# Patient Record
Sex: Male | Born: 1985 | Race: Black or African American | Hispanic: No | Marital: Single
Health system: Southern US, Community
[De-identification: ages and names within clinical notes are randomized; demographics above are authoritative.]

## PROBLEM LIST (undated history)

## (undated) DIAGNOSIS — J45909 Unspecified asthma, uncomplicated: Secondary | ICD-10-CM

---

## 2015-11-07 ENCOUNTER — Emergency Department (HOSPITAL_COMMUNITY): Payer: Self-pay

## 2015-11-07 ENCOUNTER — Emergency Department (HOSPITAL_COMMUNITY)
Admission: EM | Admit: 2015-11-07 | Discharge: 2015-11-07 | Disposition: A | Discharge status: Home / Self Care | Payer: Self-pay | Attending: Emergency Medicine | Admitting: Emergency Medicine

## 2015-11-07 ENCOUNTER — Encounter (HOSPITAL_COMMUNITY): Payer: Self-pay | Admitting: Emergency Medicine

## 2015-11-07 DIAGNOSIS — F1210 Cannabis abuse, uncomplicated: Secondary | ICD-10-CM | POA: Insufficient documentation

## 2015-11-07 DIAGNOSIS — S51812A Laceration without foreign body of left forearm, initial encounter: Secondary | ICD-10-CM | POA: Insufficient documentation

## 2015-11-07 DIAGNOSIS — F1410 Cocaine abuse, uncomplicated: Secondary | ICD-10-CM | POA: Insufficient documentation

## 2015-11-07 DIAGNOSIS — Y9389 Activity, other specified: Secondary | ICD-10-CM | POA: Insufficient documentation

## 2015-11-07 DIAGNOSIS — J45909 Unspecified asthma, uncomplicated: Secondary | ICD-10-CM | POA: Insufficient documentation

## 2015-11-07 DIAGNOSIS — S31109A Unspecified open wound of abdominal wall, unspecified quadrant without penetration into peritoneal cavity, initial encounter: Secondary | ICD-10-CM | POA: Insufficient documentation

## 2015-11-07 DIAGNOSIS — Y998 Other external cause status: Secondary | ICD-10-CM | POA: Insufficient documentation

## 2015-11-07 DIAGNOSIS — S31119A Laceration without foreign body of abdominal wall, unspecified quadrant without penetration into peritoneal cavity, initial encounter: Secondary | ICD-10-CM

## 2015-11-07 DIAGNOSIS — Y9289 Other specified places as the place of occurrence of the external cause: Secondary | ICD-10-CM | POA: Insufficient documentation

## 2015-11-07 HISTORY — DX: Unspecified asthma, uncomplicated: J45.909

## 2015-11-07 LAB — I-STAT CHEM 8, ED
BUN: 13 mg/dL (ref 6–20)
CALCIUM ION: 1.1 mmol/L — AB (ref 1.12–1.23)
Chloride: 103 mmol/L (ref 101–111)
Creatinine, Ser: 1.3 mg/dL — ABNORMAL HIGH (ref 0.61–1.24)
GLUCOSE: 109 mg/dL — AB (ref 65–99)
HCT: 46 % (ref 39.0–52.0)
HEMOGLOBIN: 15.6 g/dL (ref 13.0–17.0)
Potassium: 3.5 mmol/L (ref 3.5–5.1)
Sodium: 141 mmol/L (ref 135–145)
TCO2: 26 mmol/L (ref 0–100)

## 2015-11-07 LAB — COMPREHENSIVE METABOLIC PANEL
ALK PHOS: 63 U/L (ref 38–126)
ALT: 14 U/L — AB (ref 17–63)
AST: 26 U/L (ref 15–41)
Albumin: 4.1 g/dL (ref 3.5–5.0)
Anion gap: 11 (ref 5–15)
BILIRUBIN TOTAL: 0.4 mg/dL (ref 0.3–1.2)
BUN: 11 mg/dL (ref 6–20)
CALCIUM: 9.1 mg/dL (ref 8.9–10.3)
CO2: 25 mmol/L (ref 22–32)
CREATININE: 1.45 mg/dL — AB (ref 0.61–1.24)
Chloride: 104 mmol/L (ref 101–111)
GFR calc non Af Amer: >60 mL/min (ref >60)
GLUCOSE: 116 mg/dL — AB (ref 65–99)
Potassium: 3.6 mmol/L (ref 3.5–5.1)
SODIUM: 140 mmol/L (ref 135–145)
TOTAL PROTEIN: 6.8 g/dL (ref 6.5–8.1)

## 2015-11-07 LAB — TYPE AND SCREEN
ABO/RH(D): B POS
ANTIBODY SCREEN: NEGATIVE

## 2015-11-07 LAB — RAPID URINE DRUG SCREEN, HOSP PERFORMED
Amphetamines: NOT DETECTED
BARBITURATES: NOT DETECTED
Benzodiazepines: NOT DETECTED
Cocaine: POSITIVE — AB
OPIATES: NOT DETECTED
TETRAHYDROCANNABINOL: POSITIVE — AB

## 2015-11-07 LAB — CBC
HEMATOCRIT: 41.4 % (ref 39.0–52.0)
HEMOGLOBIN: 14 g/dL (ref 13.0–17.0)
MCH: 29.4 pg (ref 26.0–34.0)
MCHC: 33.8 g/dL (ref 30.0–36.0)
MCV: 87 fL (ref 78.0–100.0)
Platelets: 152 10*3/uL (ref 150–400)
RBC: 4.76 MIL/uL (ref 4.22–5.81)
RDW: 12.8 % (ref 11.5–15.5)
WBC: 5.9 10*3/uL (ref 4.0–10.5)

## 2015-11-07 LAB — I-STAT CG4 LACTIC ACID, ED
LACTIC ACID, VENOUS: 3.03 mmol/L — AB (ref 0.5–2.0)
LACTIC ACID, VENOUS: 0.75 mmol/L (ref 0.5–2.0)

## 2015-11-07 LAB — ABO/RH: ABO/RH(D): B POS

## 2015-11-07 LAB — ETHANOL: Alcohol, Ethyl (B): <5 mg/dL (ref <5)

## 2015-11-07 MED ORDER — SODIUM CHLORIDE 0.9 % IV BOLUS (SEPSIS)
1000.0000 mL | Freq: Once | INTRAVENOUS | Status: AC
  Administered 2015-11-07: 1000 mL via INTRAVENOUS

## 2015-11-07 MED ORDER — BACITRACIN ZINC 500 UNIT/GM EX OINT: 1.0000 "application " | TOPICAL_OINTMENT | Freq: Two times a day (BID) | CUTANEOUS | Status: AC

## 2015-11-07 MED ORDER — LIDOCAINE-EPINEPHRINE 2 %-1:100000 IJ SOLN: 20.0000 mL | Freq: Once | INTRAMUSCULAR | Status: DC

## 2015-11-07 MED ORDER — CEFAZOLIN SODIUM 1-5 GM-% IV SOLN
1.0000 g | Freq: Once | INTRAVENOUS | Status: AC
  Administered 2015-11-07: 1 g via INTRAVENOUS
  Filled 2015-11-07: qty 50

## 2015-11-07 MED ORDER — CEPHALEXIN 500 MG PO CAPS: 500.0000 mg | ORAL_CAPSULE | Freq: Four times a day (QID) | ORAL | Status: AC

## 2015-11-07 MED ORDER — MORPHINE SULFATE (PF) 4 MG/ML IV SOLN
4.0000 mg | Freq: Once | INTRAVENOUS | Status: DC
  Filled 2015-11-07: qty 1

## 2015-11-07 MED ORDER — LIDOCAINE-EPINEPHRINE (PF) 2 %-1:200000 IJ SOLN
20.0000 mL | Freq: Once | INTRAMUSCULAR | Status: AC
  Administered 2015-11-07: 20 mL via INTRADERMAL
  Filled 2015-11-07: qty 20

## 2015-11-07 MED ORDER — OXYCODONE-ACETAMINOPHEN 5-325 MG PO TABS
2.0000 | ORAL_TABLET | ORAL | Status: AC
  Administered 2015-11-07: 2 via ORAL
  Filled 2015-11-07: qty 2

## 2015-11-07 MED ORDER — ONDANSETRON HCL 4 MG/2ML IJ SOLN
4.0000 mg | Freq: Once | INTRAMUSCULAR | Status: AC
  Administered 2015-11-07: 4 mg via INTRAVENOUS
  Filled 2015-11-07: qty 2

## 2015-11-07 MED ORDER — OXYCODONE-ACETAMINOPHEN 5-325 MG PO TABS: 1.0000 | ORAL_TABLET | Freq: Four times a day (QID) | ORAL | Status: AC | PRN

## 2015-11-07 MED ORDER — OXYCODONE-ACETAMINOPHEN 5-325 MG PO TABS
2.0000 | ORAL_TABLET | Freq: Once | ORAL | Status: AC
  Administered 2015-11-07: 2 via ORAL
  Filled 2015-11-07: qty 2

## 2015-11-07 MED ORDER — IOHEXOL 300 MG/ML  SOLN
100.0000 mL | Freq: Once | INTRAMUSCULAR | Status: AC | PRN
  Administered 2015-11-07: 100 mL via INTRAVENOUS

## 2015-11-07 NOTE — Discharge Instructions (Signed)
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
## 2015-11-07 NOTE — ED Notes (Signed)
Called lab to inform that dark green top had been sent down

## 2015-11-07 NOTE — ED Notes (Signed)
Asked stephen in minilab to send down dark green to lab for ethanol
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
## 2015-11-07 NOTE — ED Notes (Signed)
Pt transported to CT ?
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
